# Patient Record
Sex: Male | Born: 1990
Health system: Southern US, Community
[De-identification: ages and names within clinical notes are randomized; demographics above are authoritative.]

## PROBLEM LIST (undated history)

## (undated) DIAGNOSIS — F32A Depression, unspecified: Secondary | ICD-10-CM

## (undated) DIAGNOSIS — F329 Major depressive disorder, single episode, unspecified: Secondary | ICD-10-CM

## (undated) DIAGNOSIS — F419 Anxiety disorder, unspecified: Secondary | ICD-10-CM

## (undated) HISTORY — PX: UPPER GI ENDOSCOPY: SHX6162

---
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
## 2012-05-22 ENCOUNTER — Emergency Department: Payer: Self-pay | Admitting: Emergency Medicine

## 2012-05-22 LAB — COMPREHENSIVE METABOLIC PANEL
Bilirubin,Total: 0.9 mg/dL (ref 0.2–1.0)
Calcium, Total: 9.5 mg/dL (ref 9.0–10.7)
Chloride: 106 mmol/L (ref 98–107)
Co2: 27 mmol/L (ref 21–32)
Creatinine: 1 mg/dL (ref 0.60–1.30)
EGFR (Non-African Amer.): >60
Osmolality: 273 (ref 275–301)
Potassium: 3.8 mmol/L (ref 3.5–5.1)
SGPT (ALT): 21 U/L (ref 12–78)
Sodium: 137 mmol/L (ref 136–145)

## 2012-05-22 LAB — URINALYSIS, COMPLETE
Bilirubin,UR: NEGATIVE
Blood: NEGATIVE
Glucose,UR: NEGATIVE mg/dL (ref 0–75)
Leukocyte Esterase: NEGATIVE
Nitrite: NEGATIVE
Protein: NEGATIVE
RBC,UR: NONE SEEN /HPF (ref 0–5)
Squamous Epithelial: 1

## 2012-05-22 LAB — CBC
HGB: 17.8 g/dL (ref 13.0–18.0)
MCHC: 36.1 g/dL — ABNORMAL HIGH (ref 32.0–36.0)
MCV: 88 fL (ref 80–100)
Platelet: 217 10*3/uL (ref 150–440)
RDW: 12.9 % (ref 11.5–14.5)

## 2012-05-22 LAB — LIPASE, BLOOD: Lipase: 138 U/L (ref 73–393)

## 2013-03-05 ENCOUNTER — Ambulatory Visit: Payer: Self-pay | Admitting: Gastroenterology

## 2013-03-08 LAB — PATHOLOGY REPORT

## 2013-04-02 ENCOUNTER — Other Ambulatory Visit: Payer: Self-pay | Admitting: Gastroenterology

## 2013-05-03 ENCOUNTER — Ambulatory Visit: Payer: Self-pay | Admitting: Gastroenterology

## 2017-04-02 ENCOUNTER — Ambulatory Visit
Admission: RE | Admit: 2017-04-02 | Discharge: 2017-04-02 | Disposition: A | Discharge status: Home / Self Care | Attending: Family Medicine | Admitting: Family Medicine

## 2017-04-02 NOTE — ED Triage Notes (Addendum)
Patient arrives to ER in custody of [employer]. Patient here for forensic blood draws. Gives consent to this RN. 2 patient verification identification. Blood obtained from L AC, patient tolerated well, no adverse events. blood obtained with collection kit provided by officer. Pt alert and oriented X4, active, cooperative, pt in NAD. RR even and unlabored, color WNL.  Paper consent obtained. Chain of custody followed. Blood in custody of Officer Rollene Rotunda

## 2017-04-02 NOTE — ED Notes (Signed)
Patient presents to the ED for forensic blood draw.  Blood draw performed by Randel PiggAlly Riley, RN.  Patient tolerated well.  Consent form signed.  Site clean and dry.

## 2018-03-09 DIAGNOSIS — K219 Gastro-esophageal reflux disease without esophagitis: Secondary | ICD-10-CM | POA: Diagnosis not present

## 2018-05-12 ENCOUNTER — Other Ambulatory Visit: Payer: Self-pay

## 2018-05-12 ENCOUNTER — Inpatient Hospital Stay
Admission: EM | Admit: 2018-05-12 | Discharge: 2018-05-14 | DRG: 871 | Disposition: A | Discharge status: Home / Self Care | Payer: Commercial Managed Care - PPO | Attending: Specialist | Admitting: Specialist

## 2018-05-12 ENCOUNTER — Emergency Department: Payer: Commercial Managed Care - PPO

## 2018-05-12 DIAGNOSIS — T402X1A Poisoning by other opioids, accidental (unintentional), initial encounter: Secondary | ICD-10-CM | POA: Diagnosis not present

## 2018-05-12 DIAGNOSIS — R7989 Other specified abnormal findings of blood chemistry: Secondary | ICD-10-CM | POA: Diagnosis present

## 2018-05-12 DIAGNOSIS — A419 Sepsis, unspecified organism: Secondary | ICD-10-CM | POA: Diagnosis not present

## 2018-05-12 DIAGNOSIS — G92 Toxic encephalopathy: Secondary | ICD-10-CM | POA: Diagnosis present

## 2018-05-12 DIAGNOSIS — R0902 Hypoxemia: Secondary | ICD-10-CM | POA: Diagnosis present

## 2018-05-12 DIAGNOSIS — T40601A Poisoning by unspecified narcotics, accidental (unintentional), initial encounter: Secondary | ICD-10-CM | POA: Diagnosis present

## 2018-05-12 DIAGNOSIS — Z20828 Contact with and (suspected) exposure to other viral communicable diseases: Secondary | ICD-10-CM | POA: Diagnosis present

## 2018-05-12 DIAGNOSIS — J690 Pneumonitis due to inhalation of food and vomit: Secondary | ICD-10-CM | POA: Diagnosis not present

## 2018-05-12 DIAGNOSIS — R778 Other specified abnormalities of plasma proteins: Secondary | ICD-10-CM | POA: Diagnosis present

## 2018-05-12 DIAGNOSIS — R000 Tachycardia, unspecified: Secondary | ICD-10-CM | POA: Diagnosis not present

## 2018-05-12 DIAGNOSIS — Y92008 Other place in unspecified non-institutional (private) residence as the place of occurrence of the external cause: Secondary | ICD-10-CM

## 2018-05-12 DIAGNOSIS — I248 Other forms of acute ischemic heart disease: Secondary | ICD-10-CM | POA: Diagnosis present

## 2018-05-12 DIAGNOSIS — I340 Nonrheumatic mitral (valve) insufficiency: Secondary | ICD-10-CM | POA: Diagnosis not present

## 2018-05-12 HISTORY — DX: Anxiety disorder, unspecified: F41.9

## 2018-05-12 HISTORY — DX: Major depressive disorder, single episode, unspecified: F32.9

## 2018-05-12 HISTORY — DX: Depression, unspecified: F32.A

## 2018-05-12 LAB — CBC
HCT: 46 % (ref 39.0–52.0)
Hemoglobin: 16.7 g/dL (ref 13.0–17.0)
MCH: 33.4 pg (ref 26.0–34.0)
MCHC: 36.3 g/dL — ABNORMAL HIGH (ref 30.0–36.0)
MCV: 92 fL (ref 80.0–100.0)
Platelets: 221 10*3/uL (ref 150–400)
RBC: 5 MIL/uL (ref 4.22–5.81)
RDW: 11.8 % (ref 11.5–15.5)
WBC: 21.3 10*3/uL — ABNORMAL HIGH (ref 4.0–10.5)
nRBC: 0 % (ref 0.0–0.2)

## 2018-05-12 LAB — COMPREHENSIVE METABOLIC PANEL
ALT: 30 U/L (ref 0–44)
AST: 40 U/L (ref 15–41)
Albumin: 4.7 g/dL (ref 3.5–5.0)
Alkaline Phosphatase: 57 U/L (ref 38–126)
Anion gap: 14 (ref 5–15)
BUN: 14 mg/dL (ref 6–20)
CO2: 23 mmol/L (ref 22–32)
Calcium: 8.8 mg/dL — ABNORMAL LOW (ref 8.9–10.3)
Chloride: 106 mmol/L (ref 98–111)
Creatinine, Ser: 1.2 mg/dL (ref 0.61–1.24)
GFR calc Af Amer: >60 mL/min (ref >60)
GFR calc non Af Amer: >60 mL/min (ref >60)
Glucose, Bld: 111 mg/dL — ABNORMAL HIGH (ref 70–99)
Potassium: 4.4 mmol/L (ref 3.5–5.1)
Sodium: 143 mmol/L (ref 135–145)
Total Bilirubin: 0.8 mg/dL (ref 0.3–1.2)
Total Protein: 7.9 g/dL (ref 6.5–8.1)

## 2018-05-12 LAB — URINE DRUG SCREEN, QUALITATIVE (ARMC ONLY)
Amphetamines, Ur Screen: NOT DETECTED
Barbiturates, Ur Screen: NOT DETECTED
Benzodiazepine, Ur Scrn: NOT DETECTED
Cannabinoid 50 Ng, Ur ~~LOC~~: POSITIVE — AB
Cocaine Metabolite,Ur ~~LOC~~: NOT DETECTED
MDMA (Ecstasy)Ur Screen: NOT DETECTED
Methadone Scn, Ur: NOT DETECTED
Opiate, Ur Screen: NOT DETECTED
Phencyclidine (PCP) Ur S: NOT DETECTED
Tricyclic, Ur Screen: NOT DETECTED

## 2018-05-12 LAB — ETHANOL: Alcohol, Ethyl (B): <10 mg/dL (ref <10)

## 2018-05-12 LAB — CK: Total CK: 239 U/L (ref 49–397)

## 2018-05-12 LAB — LACTIC ACID, PLASMA: Lactic Acid, Venous: 2.1 mmol/L (ref 0.5–1.9)

## 2018-05-12 LAB — SARS CORONAVIRUS 2 BY RT PCR (HOSPITAL ORDER, PERFORMED IN ~~LOC~~ HOSPITAL LAB): SARS Coronavirus 2: NEGATIVE

## 2018-05-12 LAB — TROPONIN I: Troponin I: 0.12 ng/mL (ref <0.03)

## 2018-05-12 MED ORDER — ONDANSETRON HCL 4 MG/2ML IJ SOLN
4.0000 mg | Freq: Once | INTRAMUSCULAR | Status: AC
  Administered 2018-05-12: 4 mg via INTRAVENOUS
  Filled 2018-05-12: qty 2

## 2018-05-12 MED ORDER — SODIUM CHLORIDE 0.9 % IV SOLN
3.0000 g | Freq: Once | INTRAVENOUS | Status: AC
  Administered 2018-05-12: 3 g via INTRAVENOUS
  Filled 2018-05-12: qty 3

## 2018-05-12 MED ORDER — SODIUM CHLORIDE 0.9 % IV BOLUS
1000.0000 mL | Freq: Once | INTRAVENOUS | Status: AC
  Administered 2018-05-13: 1000 mL via INTRAVENOUS

## 2018-05-12 MED ORDER — SODIUM CHLORIDE 0.9 % IV BOLUS
1000.0000 mL | Freq: Once | INTRAVENOUS | Status: AC
  Administered 2018-05-12: 1000 mL via INTRAVENOUS

## 2018-05-12 NOTE — ED Notes (Signed)
Pt bumped up to 5L [place] sats were at 84

## 2018-05-12 NOTE — ED Notes (Signed)
Patient sitting up in bed alert and awake, parents called to speak with patient and get an update on his status.. normal saline bolus completed. ABX infusing,. Patient requesting something to drink. Ok with Md to give patient po fluids.

## 2018-05-12 NOTE — ED Triage Notes (Signed)
Ingestion of an unknown amount of percocet's. Found by ems under car port unconscious 4mg  IV narcan with positive effects. Patient aox4 able to answer questions appropriately. C/o of back pain.

## 2018-05-12 NOTE — ED Notes (Signed)
FATHER (BOBBY) # [phone]

## 2018-05-12 NOTE — ED Notes (Signed)
Xray completed

## 2018-05-12 NOTE — ED Notes (Signed)
ED TO INPATIENT HANDOFF REPORT  ED Nurse Name and Phone #: Greta Doom [phone]  S Name/Age/Gender Miguel Fischer 28 y.o. male Room/Bed: ED16A/ED16A  Code Status   Code Status: Not on file  Home/SNF/Other Home Patient oriented to: self, place, time and situation Is this baseline? Was not A&Ox4 on arrival; is now   Triage Complete: Triage complete  Chief Complaint Overdose  Triage Note Ingestion of an unknown amount of percocet's. Found by ems under car port unconscious 4mg  IV narcan with positive effects. Patient aox4 able to answer questions appropriately. C/o of back pain.     Allergies No Known Allergies  Level of Care/Admitting Diagnosis ED Disposition    ED Disposition Condition Comment   Admit  The patient appears reasonably stabilized for admission considering the current resources, flow, and capabilities available in the ED at this time, and I doubt any other [facility] requiring further screening and/or treatment in the ED prior to admission is  present.       B Medical/Surgery History Past Medical History:  Diagnosis Date  . Anxiety   . Depression    Past Surgical History:  Procedure Laterality Date  . UPPER GI ENDOSCOPY       A IV Location/Drains/Wounds Patient Lines/Drains/Airways Status   Active Line/Drains/Airways    Name:   Placement date:   Placement time:   Site:   Days:   Peripheral IV 05/12/18 Left Antecubital   05/12/18    2045    Antecubital   less than 1          Intake/Output Last 24 hours  Intake/Output Summary (Last 24 hours) at 05/12/2018 2331 Last data filed at 05/12/2018 2213 Gross per 24 hour  Intake 1000 ml  Output -  Net 1000 ml    Labs/Imaging Results for orders placed or performed during the hospital encounter of 05/12/18 (from the past 48 hour(s))  CBC     Status: Abnormal   Collection Time: 05/12/18  9:05 PM  Result Value Ref Range   WBC 21.3 (H) 4.0 - 10.5 K/uL   RBC 5.00 4.22 - 5.81 MIL/uL   Hemoglobin 16.7 13.0 -  17.0 g/dL   HCT 21.6 24.4 - 69.5 %   MCV 92.0 80.0 - 100.0 fL   MCH 33.4 26.0 - 34.0 pg   MCHC 36.3 (H) 30.0 - 36.0 g/dL   RDW 07.2 25.7 - 50.5 %   Platelets 221 150 - 400 K/uL   nRBC 0.0 0.0 - 0.2 %    Comment: Performed at [facility], [address] [place], [place] [address]  Comprehensive metabolic panel     Status: Abnormal   Collection Time: 05/12/18  9:05 PM  Result Value Ref Range   Sodium 143 135 - 145 mmol/L   Potassium 4.4 3.5 - 5.1 mmol/L   Chloride 106 98 - 111 mmol/L   CO2 23 22 - 32 mmol/L   Glucose, Bld 111 (H) 70 - 99 mg/dL   BUN 14 6 - 20 mg/dL   Creatinine, Ser 8.25 0.61 - 1.24 mg/dL   Calcium 8.8 (L) 8.9 - 10.3 mg/dL   Total Protein 7.9 6.5 - 8.1 g/dL   Albumin 4.7 3.5 - 5.0 g/dL   AST 40 15 - 41 U/L   ALT 30 0 - 44 U/L   Alkaline Phosphatase 57 38 - 126 U/L   Total Bilirubin 0.8 0.3 - 1.2 mg/dL   GFR calc non Af Amer >60 >60 mL/min   GFR  calc Af Amer >60 >60 mL/min   Anion gap 14 5 - 15    Comment: Performed at [facility], [address] [place], [place] [address]  Troponin I - ONCE - STAT     Status: Abnormal   Collection Time: 05/12/18  9:05 PM  Result Value Ref Range   Troponin I 0.12 (HH) <0.03 ng/mL    Comment: CRITICAL RESULT CALLED TO, READ BACK BY AND VERIFIED WITH RAQUEL DAVID AT 2151 ON 05/12/2018 [facility]. Performed at [facility], [address] [place], [place] [address]   Ethanol     Status: None   Collection Time: 05/12/18  9:05 PM  Result Value Ref Range   Alcohol, Ethyl (B) <10 <10 mg/dL    Comment: (NOTE) Lowest detectable limit for serum alcohol is 10 mg/dL. For medical purposes only. Performed at [facility], [address] [place], [place] [address]   SARS Coronavirus 2 (CEPHEID - Performed in [facility] hospital lab), Hosp Order     Status: None   Collection Time: 05/12/18  9:05 PM  Result Value Ref Range   SARS Coronavirus 2 NEGATIVE NEGATIVE    Comment: (NOTE) If result is  NEGATIVE SARS-CoV-2 target nucleic acids are NOT DETECTED. The SARS-CoV-2 RNA is generally detectable in upper and lower  respiratory specimens during the acute phase of infection. The lowest  concentration of SARS-CoV-2 viral copies this assay can detect is 250  copies / mL. A negative result does not preclude SARS-CoV-2 infection  and should not be used as the sole basis for treatment or other  patient management decisions.  A negative result may occur with  improper specimen collection / handling, submission of specimen other  than nasopharyngeal swab, presence of viral mutation(s) within the  areas targeted by this assay, and inadequate number of viral copies  (<250 copies / mL). A negative result must be combined with clinical  observations, patient history, and epidemiological information. If result is POSITIVE SARS-CoV-2 target nucleic acids are DETECTED. The SARS-CoV-2 RNA is generally detectable in upper and lower  respiratory specimens dur ing the acute phase of infection.  Positive  results are indicative of active infection with SARS-CoV-2.  Clinical  correlation with patient history and other diagnostic information is  necessary to determine patient infection status.  Positive results do  not rule out bacterial infection or co-infection with other viruses. If result is PRESUMPTIVE POSTIVE SARS-CoV-2 nucleic acids MAY BE PRESENT.   A presumptive positive result was obtained on the submitted specimen  and confirmed on repeat testing.  While 2019 novel coronavirus  (SARS-CoV-2) nucleic acids may be present in the submitted sample  additional confirmatory testing may be necessary for epidemiological  and / or clinical management purposes  to differentiate between  SARS-CoV-2 and other Sarbecovirus currently known to infect humans.  If clinically indicated additional testing with an alternate test  methodology [phone]) is advised. The SARS-CoV-2 RNA is generally  detectable  in upper and lower respiratory sp ecimens during the acute  phase of infection. The expected result is Negative. Fact Sheet for Patients:  BoilerBrush.com.cy Fact Sheet for Healthcare Providers: https://pope.com/ This test is not yet approved or cleared by the [place] FDA and has been authorized for detection and/or diagnosis of SARS-CoV-2 by FDA under an Emergency Use Authorization (EUA).  This EUA will remain in effect (meaning this test can be used) for the duration of the COVID-19 declaration under Section 564(b)(1) of the Act, 21 U.S.C.  section 360bbb-3(b)(1), unless the authorization is terminated or revoked sooner. Performed at [facility], [address] [place], [place] [address]   CK     Status: None   Collection Time: 05/12/18  9:05 PM  Result Value Ref Range   Total CK 239 49 - 397 U/L    Comment: Performed at [facility], [address] [place], [place] [address]  Urine Drug Screen, Qualitative ([facility] only)     Status: Abnormal   Collection Time: 05/12/18  9:05 PM  Result Value Ref Range   Tricyclic, Ur Screen NONE DETECTED NONE DETECTED   Amphetamines, Ur Screen NONE DETECTED NONE DETECTED   MDMA (Ecstasy)Ur Screen NONE DETECTED NONE DETECTED   Cocaine Metabolite,Ur [place] NONE DETECTED NONE DETECTED   Opiate, Ur Screen NONE DETECTED NONE DETECTED   Phencyclidine (PCP) Ur S NONE DETECTED NONE DETECTED   Cannabinoid 50 Ng, Ur [place] POSITIVE (A) NONE DETECTED   Barbiturates, Ur Screen NONE DETECTED NONE DETECTED   Benzodiazepine, Ur Scrn NONE DETECTED NONE DETECTED   Methadone Scn, Ur NONE DETECTED NONE DETECTED    Comment: (NOTE) Tricyclics + metabolites, urine    Cutoff 1000 ng/mL Amphetamines + metabolites, urine  Cutoff 1000 ng/mL MDMA (Ecstasy), urine              Cutoff 500 ng/mL Cocaine Metabolite, urine          Cutoff 300 ng/mL Opiate + metabolites, urine        Cutoff 300  ng/mL Phencyclidine (PCP), urine         Cutoff 25 ng/mL Cannabinoid, urine                 Cutoff 50 ng/mL Barbiturates + metabolites, urine  Cutoff 200 ng/mL Benzodiazepine, urine              Cutoff 200 ng/mL Methadone, urine                   Cutoff 300 ng/mL The urine drug screen provides only a preliminary, unconfirmed analytical test result and should not be used for non-medical purposes. Clinical consideration and professional judgment should be applied to any positive drug screen result due to possible interfering substances. A more specific alternate chemical method must be used in order to obtain a confirmed analytical result. Gas chromatography / mass spectrometry (GC/MS) is the preferred confirmat ory method. Performed at [facility], [address] [place], [place] [address]   Lactic acid, plasma     Status: Abnormal   Collection Time: 05/12/18 10:29 PM  Result Value Ref Range   Lactic Acid, Venous 2.1 (HH) 0.5 - 1.9 mmol/L    Comment: CRITICAL RESULT CALLED TO, READ BACK BY AND VERIFIED WITH JEANETTE PEREZ AT 2259 ON 05/12/2018 [facility]. Performed at [facility], [address] [place], [place] [address]    Dg Chest Portable 1 View  Result Date: 05/12/2018 CLINICAL DATA:  Overdose, shortness of breath EXAM: PORTABLE CHEST 1 VIEW COMPARISON:  12/26/2009 FINDINGS: Patchy airspace disease throughout the lungs. Heart is normal size. No effusions. No acute bony abnormality. IMPRESSION: Patchy ill-defined bilateral airspace disease. This could reflect noncardiogenic edema, less likely infection. Electronically Signed   By: Charlett Nose M.D.   On: 05/12/2018 21:09    Pending Labs Unresulted Labs (From admission, onward)    Start     Ordered   05/13/18 0030  Lactic acid, plasma  Once,   STAT     05/12/18  2328   05/12/18 2155  Blood culture (routine x 2)  BLOOD CULTURE X 2,   STAT     05/12/18 2155          Vitals/Pain Today's Vitals   05/12/18 2055  05/12/18 2057 05/12/18 2100 05/12/18 2216  BP: 115/72  112/73 111/70  Pulse: (!) 138  (!) 134 (!) 132  Resp:   (!) 30 (!) 24  Temp: 98.2 F (36.8 C)     TempSrc: Oral     SpO2: (!) 86%  (!) 89% 92%  Weight:  79.8 kg    Height:  5\' 10"  (1.778 m)    PainSc:  6   0-No pain    Isolation Precautions No active isolations  Medications Medications  sodium chloride 0.9 % bolus 1,000 mL (has no administration in time range)  sodium chloride 0.9 % bolus 1,000 mL (0 mLs Intravenous Stopped 05/12/18 2213)  ondansetron (ZOFRAN) injection 4 mg (4 mg Intravenous Given 05/12/18 2110)  Ampicillin-Sulbactam (UNASYN) 3 g in sodium chloride 0.9 % 100 mL IVPB (3 g Intravenous New Bag/Given 05/12/18 2215)    Mobility walks Low fall risk   Focused Assessments Remains ST on monitor; remains on 4L [place]   R Recommendations: See Admitting Provider Note  Report given to:   Additional Notes:  Given narcan before arrival; septic workup completed

## 2018-05-12 NOTE — ED Provider Notes (Signed)
[facility] Emergency Department Provider Note  Time seen: 9:05 PM  I have reviewed the triage vital signs and the nursing notes.   HISTORY  Chief Complaint Drug Overdose   HPI Miguel Fischer is a 28 y.o. male with a past medical history anxiety, depression, presents to the emergency department being found unresponsive.  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  Patient states he snorted a tablet tonight, does not know if he took any more than that or not.  Patient was last heard from around 4 PM and was found around 8 PM unresponsive at his home.   Patient received 4 mg of intranasal Narcan without response, received 4 mg of IV Narcan and the patient awoke.  Patient on arrival is awake alert oriented however he remains somewhat hypoxic satting in the upper 80s on room air.  Patient is tachycardic around 140 bpm.  Patient does state minimal shortness of breath.  Denies any recent cough or fever.   Past Medical History:  Diagnosis Date  . Anxiety   . Depression     There are no active problems to display for this patient.   History reviewed. No pertinent surgical history.  Prior to Admission medications   Not on File    No Known Allergies  History reviewed. No pertinent family history.  Social History Social History   Tobacco Use  . Smoking status: Never Smoker  . Smokeless tobacco: Never Used  Substance Use Topics  . Alcohol use: Not on file  . Drug use: Not on file    Review of Systems Constitutional: Negative for fever. ENT: Negative for recent illness/congestion Cardiovascular: Negative for chest pain. Respiratory: Mild shortness of breath currently. Gastrointestinal: Negative for abdominal pain, vomiting Musculoskeletal: Negative for musculoskeletal complaints Skin: Negative for skin complaints  Neurological: Negative for headache All other ROS  negative  ____________________________________________   PHYSICAL EXAM:  VITAL SIGNS: ED Triage Vitals  Enc Vitals Group     BP 05/12/18 2055 115/72     Pulse Rate 05/12/18 2055 (!) 138     Resp --      Temp 05/12/18 2055 98.2 F (36.8 C)     Temp Source 05/12/18 2055 Oral     SpO2 05/12/18 2055 (!) 86 %     Weight 05/12/18 2057 176 lb (79.8 kg)     Height 05/12/18 2057 5\' 10"  (1.778 m)     Head Circumference --      Peak Flow --      Pain Score 05/12/18 2057 6     Pain Loc --      Pain Edu? --      Excl. in GC? --    Constitutional: Alert and oriented. Well appearing and in no distress. Eyes: Normal exam ENT      Head: Normocephalic and atraumatic.      Mouth/Throat: Mucous membranes are moist. Cardiovascular: Regular rhythm, rate around 140 bpm. Respiratory: Normal respiratory effort without tachypnea nor retractions. Breath sounds are clear Gastrointestinal: Soft and nontender. No distention.   Musculoskeletal: Nontender with normal range of motion in all extremities.  Neurologic:  Normal speech and language. No gross focal neurologic deficits  Skin:  Skin is warm, dry and intact.  Psychiatric: Mood and affect are normal.   ____________________________________________    EKG  EKG viewed and interpreted by myself shows a sinus tachycardia 140 bpm with a narrow QRS, normal axis, normal intervals, no  concerning ST changes noted.  ____________________________________________    RADIOLOGY  IMPRESSION: Patchy ill-defined bilateral airspace disease. This could reflect noncardiogenic edema, less likely infection.  ____________________________________________   INITIAL IMPRESSION / ASSESSMENT AND PLAN / ED COURSE  Pertinent labs & imaging results that were available during my care of the patient were reviewed by me and considered in my medical decision making (see chart for details).   Patient presents emergency department after opioid overdose.  Patient admits  to snorting Percocet.  Received 4 mg of intranasal Narcan 4 mg of IV Narcan the patient is awake alert and oriented x4 upon arrival.  Patient remained somewhat hypoxic in the upper 80s on room air.  Patient could have been down for up to 4 hours.  We will check labs, chest x-ray, treat with IV fluids and nausea medication.  We will test for corona as a precaution given his low saturation.  We will continue to closely monitor in the emergency department.  Differential for the hypoxia at this time could be drug related although the patient appears to be breathing normally at this time.  Could be part related that the patient had prolonged downtime, could be pneumonia or upper respiratory infection related.  Patient remains hypoxic and tachycardic.  Chest x-ray consistent with bilateral opacities possible noncardiogenic pulmonary edema.  Patient's white blood cell count is elevated to 21,000 and elevated troponin 0 0.12.  This is very likely related to prolonged downtime.  We will continue with IV fluids, cover with IV Unasyn for aspiration pneumonia.  Patient requiring 2 L of oxygen to maintain sats in the 90s.  Patient will be admitted to the hospital service for further treatment.  Miguel Fischer was evaluated in Emergency Department on 05/12/2018 for the symptoms described in the history of present illness. He was evaluated in the context of the global COVID-19 pandemic, which necessitated consideration that the patient might be at risk for infection with the SARS-CoV-2 virus that causes COVID-19. Institutional protocols and algorithms that pertain to the evaluation of patients at risk for COVID-19 are in a state of rapid change based on information released by regulatory bodies including the CDC and federal and state organizations. These policies and algorithms were followed during the patient's care in the ED.  ____________________________________________   FINAL CLINICAL IMPRESSION(S) / ED  DIAGNOSES  Opioid overdose Aspiration pneumonia   Minna AntisPaduchowski, Ovide Dusek, MD 05/12/18 2208

## 2018-05-13 ENCOUNTER — Inpatient Hospital Stay (HOSPITAL_COMMUNITY)
Admit: 2018-05-13 | Discharge: 2018-05-13 | Disposition: A | Discharge status: Home / Self Care | Payer: Commercial Managed Care - PPO | Attending: Internal Medicine | Admitting: Internal Medicine

## 2018-05-13 DIAGNOSIS — I248 Other forms of acute ischemic heart disease: Secondary | ICD-10-CM | POA: Diagnosis present

## 2018-05-13 DIAGNOSIS — Y92008 Other place in unspecified non-institutional (private) residence as the place of occurrence of the external cause: Secondary | ICD-10-CM | POA: Diagnosis not present

## 2018-05-13 DIAGNOSIS — I340 Nonrheumatic mitral (valve) insufficiency: Secondary | ICD-10-CM | POA: Diagnosis not present

## 2018-05-13 DIAGNOSIS — A419 Sepsis, unspecified organism: Secondary | ICD-10-CM | POA: Diagnosis present

## 2018-05-13 DIAGNOSIS — T402X1A Poisoning by other opioids, accidental (unintentional), initial encounter: Secondary | ICD-10-CM | POA: Diagnosis present

## 2018-05-13 DIAGNOSIS — J690 Pneumonitis due to inhalation of food and vomit: Secondary | ICD-10-CM | POA: Diagnosis present

## 2018-05-13 DIAGNOSIS — R778 Other specified abnormalities of plasma proteins: Secondary | ICD-10-CM | POA: Diagnosis present

## 2018-05-13 DIAGNOSIS — T40601A Poisoning by unspecified narcotics, accidental (unintentional), initial encounter: Secondary | ICD-10-CM | POA: Diagnosis present

## 2018-05-13 DIAGNOSIS — R0902 Hypoxemia: Secondary | ICD-10-CM | POA: Diagnosis present

## 2018-05-13 DIAGNOSIS — R7989 Other specified abnormal findings of blood chemistry: Secondary | ICD-10-CM

## 2018-05-13 DIAGNOSIS — G92 Toxic encephalopathy: Secondary | ICD-10-CM | POA: Diagnosis present

## 2018-05-13 DIAGNOSIS — Z20828 Contact with and (suspected) exposure to other viral communicable diseases: Secondary | ICD-10-CM | POA: Diagnosis present

## 2018-05-13 LAB — BASIC METABOLIC PANEL
Anion gap: 7 (ref 5–15)
BUN: 14 mg/dL (ref 6–20)
CO2: 24 mmol/L (ref 22–32)
Calcium: 7.9 mg/dL — ABNORMAL LOW (ref 8.9–10.3)
Chloride: 107 mmol/L (ref 98–111)
Creatinine, Ser: 0.97 mg/dL (ref 0.61–1.24)
GFR calc Af Amer: >60 mL/min (ref >60)
GFR calc non Af Amer: >60 mL/min (ref >60)
Glucose, Bld: 99 mg/dL (ref 70–99)
Potassium: 4.5 mmol/L (ref 3.5–5.1)
Sodium: 138 mmol/L (ref 135–145)

## 2018-05-13 LAB — CBC
HCT: 37.5 % — ABNORMAL LOW (ref 39.0–52.0)
Hemoglobin: 13.5 g/dL (ref 13.0–17.0)
MCH: 32.3 pg (ref 26.0–34.0)
MCHC: 36 g/dL (ref 30.0–36.0)
MCV: 89.7 fL (ref 80.0–100.0)
Platelets: 195 10*3/uL (ref 150–400)
RBC: 4.18 MIL/uL — ABNORMAL LOW (ref 4.22–5.81)
RDW: 11.8 % (ref 11.5–15.5)
WBC: 15.6 10*3/uL — ABNORMAL HIGH (ref 4.0–10.5)
nRBC: 0 % (ref 0.0–0.2)

## 2018-05-13 LAB — ECHOCARDIOGRAM COMPLETE
Height: 70 in
Weight: 2838.4 oz

## 2018-05-13 LAB — TROPONIN I
Troponin I: 0.25 ng/mL (ref <0.03)
Troponin I: 0.21 ng/mL (ref <0.03)

## 2018-05-13 LAB — LACTIC ACID, PLASMA: Lactic Acid, Venous: 1.1 mmol/L (ref 0.5–1.9)

## 2018-05-13 MED ORDER — SERTRALINE HCL 50 MG PO TABS
100.0000 mg | ORAL_TABLET | Freq: Every day | ORAL | Status: DC
  Administered 2018-05-13 – 2018-05-14 (×2): 100 mg via ORAL
  Filled 2018-05-13 (×2): qty 2

## 2018-05-13 MED ORDER — ONDANSETRON HCL 4 MG PO TABS
4.0000 mg | ORAL_TABLET | Freq: Four times a day (QID) | ORAL | Status: DC | PRN
  Administered 2018-05-13: 4 mg via ORAL
  Filled 2018-05-13: qty 1

## 2018-05-13 MED ORDER — SODIUM CHLORIDE 0.9 % IV SOLN
INTRAVENOUS | Status: DC | PRN
  Administered 2018-05-13: 250 mL via INTRAVENOUS
  Administered 2018-05-14: 10 mL via INTRAVENOUS

## 2018-05-13 MED ORDER — SODIUM CHLORIDE 0.9 % IV SOLN: 3.0000 g | Freq: Three times a day (TID) | INTRAVENOUS | Status: DC

## 2018-05-13 MED ORDER — SODIUM CHLORIDE 0.9 % IV SOLN
INTRAVENOUS | Status: AC
  Administered 2018-05-13: 02:00:00 via INTRAVENOUS

## 2018-05-13 MED ORDER — SODIUM CHLORIDE 0.9 % IV SOLN
3.0000 g | Freq: Four times a day (QID) | INTRAVENOUS | Status: DC
  Administered 2018-05-13 – 2018-05-14 (×6): 3 g via INTRAVENOUS
  Filled 2018-05-13 (×9): qty 3

## 2018-05-13 MED ORDER — ACETAMINOPHEN 650 MG RE SUPP: 650.0000 mg | Freq: Four times a day (QID) | RECTAL | Status: DC | PRN

## 2018-05-13 MED ORDER — ONDANSETRON HCL 4 MG/2ML IJ SOLN: 4.0000 mg | Freq: Four times a day (QID) | INTRAMUSCULAR | Status: DC | PRN

## 2018-05-13 MED ORDER — SODIUM CHLORIDE 0.9% FLUSH
3.0000 mL | Freq: Two times a day (BID) | INTRAVENOUS | Status: DC
  Administered 2018-05-13: 3 mL via INTRAVENOUS
  Administered 2018-05-14: 05:00:00 via INTRAVENOUS
  Administered 2018-05-14: 3 mL via INTRAVENOUS

## 2018-05-13 MED ORDER — ACETAMINOPHEN 325 MG PO TABS: 650.0000 mg | ORAL_TABLET | Freq: Four times a day (QID) | ORAL | Status: DC | PRN

## 2018-05-13 MED ORDER — ENOXAPARIN SODIUM 40 MG/0.4ML ~~LOC~~ SOLN
40.0000 mg | SUBCUTANEOUS | Status: DC
  Administered 2018-05-13 – 2018-05-14 (×2): 40 mg via SUBCUTANEOUS
  Filled 2018-05-13 (×2): qty 0.4

## 2018-05-13 NOTE — Progress Notes (Signed)
Pt weaned to room air. Saturation is 95% at rest and 97% with ambulation around the nurses station. I will continue to assess.

## 2018-05-13 NOTE — Progress Notes (Signed)
[place] [facility] [place] NAME: Miguel Fischer    MR#:  [id number]  DATE OF BIRTH:  1990-08-29  SUBJECTIVE:   Patient presented to the hospital secondary to drug overdose secondary to opioids and suspected to have aspiration pneumonia.  No acute events overnight, more awake and alert today.  No nausea or vomiting.  REVIEW OF SYSTEMS:    Review of Systems  Constitutional: Negative for chills and fever.  HENT: Negative for congestion and tinnitus.   Eyes: Negative for blurred vision and double vision.  Respiratory: Negative for cough, shortness of breath and wheezing.   Cardiovascular: Negative for chest pain, orthopnea and PND.  Gastrointestinal: Negative for abdominal pain, diarrhea, nausea and vomiting.  Genitourinary: Negative for dysuria and hematuria.  Neurological: Negative for dizziness, sensory change and focal weakness.  All other systems reviewed and are negative.   Nutrition: Regular Tolerating Diet: Yes Tolerating PT: Ambulatory  DRUG ALLERGIES:  No Known Allergies  VITALS:  Blood pressure 108/78, pulse 92, temperature 98 F (36.7 C), temperature source Oral, resp. rate 18, height _0  (1.778 m), weight 80.5 kg, SpO2 95 %.  PHYSICAL EXAMINATION:   Physical Exam  GENERAL:  28 y.o.-year-old patient lying in bed in no acute distress.  EYES: Pupils equal, round, reactive to light and accommodation. No scleral icterus. Extraocular muscles intact.  HEENT: Head atraumatic, normocephalic. Oropharynx and nasopharynx clear.  NECK:  Supple, no jugular venous distention. No thyroid enlargement, no tenderness.  LUNGS: Normal breath sounds bilaterally, minimal end-exp wheezing b/l, No rales, rhonchi. No use of accessory muscles of respiration.  CARDIOVASCULAR: S1, S2 normal. No murmurs, rubs, or gallops.  ABDOMEN: Soft, nontender, nondistended. Bowel sounds present. No organomegaly or mass.  EXTREMITIES: No cyanosis, clubbing or  edema b/l.    NEUROLOGIC: Cranial nerves II through XII are intact. No focal Motor or sensory deficits b/l.   PSYCHIATRIC: The patient is alert and oriented x 3.  SKIN: No obvious rash, lesion, or ulcer.    LABORATORY PANEL:   CBC Recent Labs  Lab 05/13/18 0231  WBC 15.6*  HGB 13.5  HCT 37.5*  PLT 195   ------------------------------------------------------------------------------------------------------------------  Chemistries  Recent Labs  Lab 05/12/18 2105 05/13/18 0231  NA 143 138  K 4.4 4.5  CL 106 107  CO2 23 24  GLUCOSE 111* 99  BUN 14 14  CREATININE 1.20 0.97  CALCIUM 8.8* 7.9*  AST 40  --   ALT 30  --   ALKPHOS 57  --   BILITOT 0.8  --    ------------------------------------------------------------------------------------------------------------------  Cardiac Enzymes Recent Labs  Lab 05/13/18 0918  TROPONINI 0.21*   ------------------------------------------------------------------------------------------------------------------  RADIOLOGY:  Dg Chest Portable 1 View  Result Date: 05/12/2018 CLINICAL DATA:  Overdose, shortness of breath EXAM: PORTABLE CHEST 1 VIEW COMPARISON:  12/26/2009 FINDINGS: Patchy airspace disease throughout the lungs. Heart is normal size. No effusions. No acute bony abnormality. IMPRESSION: Patchy ill-defined bilateral airspace disease. This could reflect noncardiogenic edema, less likely infection. Electronically Signed   By: Rolm Baptise M.D.   On: 05/12/2018 21:09     ASSESSMENT AND PLAN:   28 year old male with past medical history of depression, substance abuse who presented to the hospital due to altered mental status and suspected sepsis.  1.  Altered mental status/encephalopathy- secondary to substance abuse.  Patient apparently had snorted too much Percocet and became unresponsive.  Patient was given Narcan. - Mental status much improved and back to baseline.  2.  Sepsis-patient met criteria given leukocytosis,  elevated lactic acid and possible aspiration pneumonia on chest x-ray. -Continue IV Unasyn, follow hemodynamics.  Follow cultures.  3.  Aspiration pneumonia- continue IV Unasyn, follow cultures and clinically.    4.  Elevated troponin- suspected to be secondary to demand ischemia from patient receiving CPR while he was unresponsive. - Await echocardiogram results.     All the records are reviewed and case discussed with Care Management/Social Worker. Management plans discussed with the patient, family and they are in agreement.  CODE STATUS: Full code  DVT Prophylaxis: Lovenox  TOTAL TIME TAKING CARE OF THIS PATIENT: 30 minutes.   POSSIBLE D/C IN 1-2 DAYS, DEPENDING ON CLINICAL CONDITION.   Henreitta Leber M.D on 05/13/2018 at 1:43 PM  Between 7am to 6pm - Pager - [phone]  After 6pm go to [email or web address] - Proofreader  [facility]  Office  [phone]  CC: Primary care physician; Juluis Pitch, MD

## 2018-05-13 NOTE — Progress Notes (Signed)
*  PRELIMINARY RESULTS* Echocardiogram 2D Echocardiogram has been performed.  Joanette Gula Ailsa Mireles 05/13/2018, 10:07 AM

## 2018-05-13 NOTE — Plan of Care (Signed)
Pt arrived to unit from ED this shift. Alert and oriented x 4. Attempted to wean from O2@4L ; however, spO2 dropped to 92% quickly. Placed back on 4L. VSS Will continue to monitor.   Problem: Health Behavior/Discharge Planning: Goal: Ability to manage health-related needs will improve Outcome: Progressing   Problem: Clinical Measurements: Goal: Ability to maintain clinical measurements within normal limits will improve Outcome: Progressing Goal: Will remain free from infection Outcome: Progressing Goal: Respiratory complications will improve Outcome: Progressing   Problem: Activity: Goal: Risk for activity intolerance will decrease Outcome: Progressing   Problem: Coping: Goal: Level of anxiety will decrease Outcome: Progressing   Problem: Safety: Goal: Ability to remain free from injury will improve Outcome: Progressing

## 2018-05-13 NOTE — ED Notes (Signed)
Pt trialled on 2L [place] while transporting to 245 with this RN. Sat 97%.

## 2018-05-13 NOTE — H&P (Signed)
[facility] [place] at [facility]   PATIENT NAME: Miguel Fischer    MR#:  [id number]  DATE OF BIRTH:  21-Dec-1990  DATE OF ADMISSION:  05/12/2018  PRIMARY CARE PHYSICIAN: Dorothey Baseman, MD   REQUESTING/REFERRING PHYSICIAN: Lenard Lance, MD  CHIEF COMPLAINT:   Chief Complaint  Patient presents with  . Drug Overdose    HISTORY OF PRESENT ILLNESS:  Miguel Fischer  is a 28 y.o. male who presents with chief complaint as above.  Patient presents the ED after being found unresponsive.  He states that he snorted some Percocet earlier today, and was found unresponsive after being down on the concrete for about 4 hours.  He is more awake now and alert and oriented and talking.  Evaluation here in the ED shows opacities on chest x-ray, potentially aspiration event.  He does meet sepsis criteria, and has new oxygen requirement.Marland Kitchen  Hospitalist were called for admission  PAST MEDICAL HISTORY:   Past Medical History:  Diagnosis Date  . Anxiety   . Depression      PAST SURGICAL HISTORY:   Past Surgical History:  Procedure Laterality Date  . UPPER GI ENDOSCOPY       SOCIAL HISTORY:   Social History   Tobacco Use  . Smoking status: Never Smoker  . Smokeless tobacco: Never Used  Substance Use Topics  . Alcohol use: Not on file     FAMILY HISTORY:   Family History  Problem Relation Age of Onset  . Depression Mother   . Crohn's disease Father   . Depression Sister   . Diabetes Sister      DRUG ALLERGIES:  No Known Allergies  MEDICATIONS AT HOME:   Prior to Admission medications   Not on File    REVIEW OF SYSTEMS:  Review of Systems  Constitutional: Negative for chills, fever, malaise/fatigue and weight loss.  HENT: Negative for ear pain, hearing loss and tinnitus.   Eyes: Negative for blurred vision, double vision, pain and redness.  Respiratory: Positive for shortness of breath. Negative for cough and hemoptysis.   Cardiovascular:  Negative for chest pain, palpitations, orthopnea and leg swelling.  Gastrointestinal: Negative for abdominal pain, constipation, diarrhea, nausea and vomiting.  Genitourinary: Negative for dysuria, frequency and hematuria.  Musculoskeletal: Negative for back pain, joint pain and neck pain.  Skin:       No acne, rash, or lesions  Neurological: Positive for loss of consciousness. Negative for dizziness, tremors, focal weakness and weakness.  Endo/Heme/Allergies: Negative for polydipsia. Does not bruise/bleed easily.  Psychiatric/Behavioral: Negative for depression. The patient is not nervous/anxious and does not have insomnia.      VITAL SIGNS:   Vitals:   05/12/18 2315 05/12/18 2330 05/13/18 0000 05/13/18 0015  BP:  119/87 (!) 93/57   Pulse: (!) 125   (!) 117  Resp: 12  17   Temp:      TempSrc:      SpO2: 95%   94%  Weight:      Height:       Wt Readings from Last 3 Encounters:  05/12/18 79.8 kg    PHYSICAL EXAMINATION:  Physical Exam  Vitals reviewed. Constitutional: He is oriented to person, place, and time. He appears well-developed and well-nourished. No distress.  HENT:  Head: Normocephalic and atraumatic.  Mouth/Throat: Oropharynx is clear and moist.  Eyes: Pupils are equal, round, and reactive to light. Conjunctivae and EOM are normal. No scleral icterus.  Neck: Normal range of motion. Neck  supple. No JVD present. No thyromegaly present.  Cardiovascular: Regular rhythm and intact distal pulses. Exam reveals no gallop and no friction rub.  No murmur heard. Tachycardic  Respiratory: Effort normal. No respiratory distress. He has no wheezes. He has no rales.  Rhonchi  GI: Soft. Bowel sounds are normal. He exhibits no distension. There is no abdominal tenderness.  Musculoskeletal: Normal range of motion.        General: No edema.     Comments: No arthritis, no gout  Lymphadenopathy:    He has no cervical adenopathy.  Neurological: He is alert and oriented to person,  place, and time. No cranial nerve deficit.  No dysarthria, no aphasia  Skin: Skin is warm and dry. No rash noted. No erythema.  Psychiatric: He has a normal mood and affect. His behavior is normal. Judgment and thought content normal.    LABORATORY PANEL:   CBC Recent Labs  Lab 05/12/18 2105  WBC 21.3*  HGB 16.7  HCT 46.0  PLT 221   ------------------------------------------------------------------------------------------------------------------  Chemistries  Recent Labs  Lab 05/12/18 2105  NA 143  K 4.4  CL 106  CO2 23  GLUCOSE 111*  BUN 14  CREATININE 1.20  CALCIUM 8.8*  AST 40  ALT 30  ALKPHOS 57  BILITOT 0.8   ------------------------------------------------------------------------------------------------------------------  Cardiac Enzymes Recent Labs  Lab 05/12/18 2105  TROPONINI 0.12*   ------------------------------------------------------------------------------------------------------------------  RADIOLOGY:  Dg Chest Portable 1 View  Result Date: 05/12/2018 CLINICAL DATA:  Overdose, shortness of breath EXAM: PORTABLE CHEST 1 VIEW COMPARISON:  12/26/2009 FINDINGS: Patchy airspace disease throughout the lungs. Heart is normal size. No effusions. No acute bony abnormality. IMPRESSION: Patchy ill-defined bilateral airspace disease. This could reflect noncardiogenic edema, less likely infection. Electronically Signed   By: Charlett Nose M.D.   On: 05/12/2018 21:09    EKG:   Orders placed or performed during the hospital encounter of 05/12/18  . ED EKG  . ED EKG    IMPRESSION AND PLAN:  Principal Problem:   Sepsis (HCC) -due to aspiration event, lactic acid mildly elevated, we will continue IV fluids and recheck lactic acid until within normal limits, blood pressure stable, culture sent from the ED Active Problems:   Narcotic overdose [facility]) -discussed with patient how close he came to dying and the importance of seeking help for his substance abuse.   Patient states that he is interested in rehab and states that his parents may be securing this for him.   Aspiration pneumonia (HCC) -IV antibiotics, supportive treatment   Elevated troponin -this is likely related to his episode of hypoxia.  However, we will trend his cardiac enzymes and get an echocardiogram.  Cardiology consult could be considered if his tests show any significant abnormality  Chart review performed and case discussed with ED provider. Labs, imaging and/or ECG reviewed by provider and discussed with patient/family. Management plans discussed with the patient and/or family.  COVID-19 status: Tested negative     DVT PROPHYLAXIS: SubQ lovenox   GI PROPHYLAXIS:  None  ADMISSION STATUS: Inpatient     CODE STATUS: Full  TOTAL TIME TAKING CARE OF THIS PATIENT: 45 minutes.   This patient was evaluated in the context of the global COVID-19 pandemic, which necessitated consideration that the patient might be at risk for infection with the SARS-CoV-2 virus that causes COVID-19. Institutional protocols and algorithms that pertain to the evaluation of patients at risk for COVID-19 are in a state of rapid change based on information released  by regulatory bodies including the CDC and federal and state organizations. These policies and algorithms were followed to the best of this provider's knowledge to date during the patient's care at this facility.  Barney DrainDavid F Denny Mccree 05/13/2018, 12:25 AM  [facility]  Office  [phone]  CC: Primary care physician; Dorothey BasemanBronstein, Cobe Viney, MD  Note:  This document was prepared using Dragon voice recognition software and may include unintentional dictation errors.

## 2018-05-13 NOTE — Progress Notes (Signed)
Pharmacy Antibiotic Note  OSIAH HYE is a 28 y.o. male admitted on 05/12/2018 with aspiration pneumonia s/t drug overdose.  Pharmacy has been consulted for Unasyn dosing.  Plan: Will start patient on Unasyn 3g IV q6h per CrCl > 30 ml/min. Will continue to monitor s/sx and resolution of infx as well as renal function.  Height: 5\' 10"  (177.8 cm) Weight: 176 lb (79.8 kg) IBW/kg (Calculated) : 73  Temp (24hrs), Avg:98.2 F (36.8 C), Min:98.2 F (36.8 C), Max:98.2 F (36.8 C)  Recent Labs  Lab 05/12/18 2105 05/12/18 2229  WBC 21.3*  --   CREATININE 1.20  --   LATICACIDVEN  --  2.1*    Estimated Creatinine Clearance: 97.2 mL/min (by C-G formula based on SCr of 1.2 mg/dL).    No Known Allergies  Thank you for allowing pharmacy to be a part of this patient's care.  Thomasene Ripple, PharmD, BCPS Clinical Pharmacist 05/13/2018

## 2018-05-13 NOTE — ED Notes (Signed)
Room temp turned down as pt states he is hot. Pt given another water.

## 2018-05-13 NOTE — TOC Initial Note (Signed)
Transition of Care [facility]) - Initial/Assessment Note    Patient Details  Name: Miguel Fischer MRN: [id number] Date of Birth: October 03, 1990  Transition of [facility]) CM/SW Contact:    Miguel Kerns, RN Phone Number: 05/13/2018, 4:49 PM  Clinical Narrative:       admitted with over dose of percocet.  To patient room for assessment as SW consult placed.  Patient is A&O x4; very appropriate and respectful.  He states he would like to go to [facility] at discharge.  Had patient sign medical release form and he gave permission for this RNCM to contact [facility] and his mother Miguel Fischer.  Spoke with Miguel Fischer in admissions.  Faxed medical release form and medical [phone] .  Miguel Fischer  has already spoke with patient and is Psychiatrist.  Spoke with Miguel Fischer (mother) [phone] and she states she will be able to transport him when discharged.  Will continue to assist with DC planning.            Expected Discharge Plan: IP Rehab Facility Barriers to Discharge: Continued Medical Work up   Patient Goals and CMS Choice        Expected Discharge Plan and Services Expected Discharge Plan: IP Rehab Facility   Discharge Planning Services: CM Consult   Living arrangements for the past 2 months: Apartment                                      Prior Living Arrangements/Services Living arrangements for the past 2 months: Apartment Lives with:: Significant Other Patient language and need for interpreter reviewed:: Yes              Criminal Activity/Legal Involvement Pertinent to Current Situation/Hospitalization: No - Comment as needed  Activities of Daily Living Home Assistive Devices/Equipment: Contact lenses ADL Screening (condition at time of admission) Patient's cognitive ability adequate to safely complete daily activities?: Yes Is the patient deaf or have difficulty hearing?: No Does the patient have difficulty seeing, even when wearing  glasses/contacts?: No Does the patient have difficulty concentrating, remembering, or making decisions?: No Patient able to express need for assistance with ADLs?: Yes Does the patient have difficulty dressing or bathing?: No Independently performs ADLs?: Yes (appropriate for developmental age) Does the patient have difficulty walking or climbing stairs?: No Weakness of Legs: Right Weakness of Arms/Hands: None  Permission Sought/Granted Permission sought to share information with : Photographer granted to share info w AGENCY: [facility]        Emotional Assessment Appearance:: Appears stated age Attitude/Demeanor/Rapport: Gracious Affect (typically observed): Accepting Orientation: : Oriented to Self, Oriented to Place, Oriented to  Time, Oriented to Situation Alcohol / Substance Use: Illicit Drugs    Admission diagnosis:  Opiate overdose, accidental or unintentional, initial encounter (HCC) [T40.601A] Aspiration pneumonia, unspecified aspiration pneumonia type, unspecified laterality, unspecified part of lung (HCC) [J69.0] Patient Active Problem List   Diagnosis Date Noted  . Elevated troponin 05/13/2018  . Narcotic overdose (HCC) 05/12/2018  . Sepsis (HCC) 05/12/2018  . Aspiration pneumonia (HCC) 05/12/2018   PCP:  Dorothey Baseman, MD Pharmacy:   [facility] [phone] - [place], [place] - [address] [place] [address] Phone: [phone] Fax: [phone]     Social Determinants of Health (SDOH) Interventions    Readmission Risk Interventions No flowsheet data found.

## 2018-05-13 NOTE — ED Notes (Signed)
Pt accidentally hit call bell. Denies any needs.

## 2018-05-14 LAB — CBC
HCT: 37.9 % — ABNORMAL LOW (ref 39.0–52.0)
Hemoglobin: 13.5 g/dL (ref 13.0–17.0)
MCH: 32.2 pg (ref 26.0–34.0)
MCHC: 35.6 g/dL (ref 30.0–36.0)
MCV: 90.5 fL (ref 80.0–100.0)
Platelets: 174 10*3/uL (ref 150–400)
RBC: 4.19 MIL/uL — ABNORMAL LOW (ref 4.22–5.81)
RDW: 11.7 % (ref 11.5–15.5)
WBC: 6.6 10*3/uL (ref 4.0–10.5)
nRBC: 0 % (ref 0.0–0.2)

## 2018-05-14 LAB — HIV ANTIBODY (ROUTINE TESTING W REFLEX): HIV Screen 4th Generation wRfx: NONREACTIVE

## 2018-05-14 MED ORDER — SERTRALINE HCL 100 MG PO TABS: 100.0000 mg | ORAL_TABLET | Freq: Every day | ORAL | Status: AC

## 2018-05-14 NOTE — TOC Transition Note (Signed)
Transition of Care [facility]) - CM/SW Discharge Note   Patient Details  Name: Miguel Fischer MRN: [id number] Date of Birth: 03-09-1990  Transition of Care [facility]) CM/SW Contact:  Sherren Kerns, RN Phone Number: 05/14/2018, 1:32 PM   Clinical Narrative:   Sherron Monday with Josh at [facility] in admissions. Everything is set up for patient to be admitted to [facility] this evening or tomorrow morning once the medical director there reviews.  [name], mother will transport home today.    Final next level of care: Home/Self Care Barriers to Discharge: No Barriers Identified   Patient Goals and CMS Choice        Discharge Placement                       Discharge Plan and Services   Discharge Planning Services: CM Consult                                 Social Determinants of Health (SDOH) Interventions     Readmission Risk Interventions No flowsheet data found.

## 2018-05-14 NOTE — Discharge Summary (Signed)
[facility] NAME: Miguel Fischer    MR#:  [id number]  DATE OF BIRTH:  1990-02-13  DATE OF ADMISSION:  05/12/2018 ADMITTING PHYSICIAN: Lance Coon, MD  DATE OF DISCHARGE: 05/14/2018  PRIMARY CARE PHYSICIAN: Juluis Pitch, MD    ADMISSION DIAGNOSIS:  Opiate overdose, accidental or unintentional, initial encounter ([place]) [T40.601A] Aspiration pneumonia, unspecified aspiration pneumonia type, unspecified laterality, unspecified part of lung ([place]) [J69.0]  DISCHARGE DIAGNOSIS:  Principal Problem:   Sepsis ([place]) Active Problems:   Narcotic overdose ([place])   Aspiration pneumonia ([place])   Elevated troponin   SECONDARY DIAGNOSIS:   Past Medical History:  Diagnosis Date  . Anxiety   . Depression     HOSPITAL COURSE:   28 year old male with past medical history of depression, substance abuse who presented to the hospital due to altered mental status and suspected sepsis.  1.  Altered mental status/encephalopathy- secondary to substance abuse.  Patient apparently had snorted too much Percocet and became unresponsive.  Patient was given Narcan. - Mental status much improved and is now back to baseline.  2.  Sepsis-patient met criteria on admission given leukocytosis, elevated lactic acid and possible aspiration pneumonia on chest x-ray. Patient was empirically given IV Unasyn.  Blood cultures have remained negative.  Sepsis has not been ruled out.  Patient is not being discharged on antibiotics.  His white cell count is normalized, he is afebrile and hemodynamically stable with a normal lactate level now.  3.  Aspiration pneumonia- initially thought to be the source of patient's sepsis.  This has been ruled out now.  Empirically patient was given IV Unasyn but presently patient is being discharged on no antibiotics.  This was likely aspiration pneumonitis with no clinical evidence of pneumonia.   4.  Elevated troponin- suspected to  be secondary to demand ischemia from patient receiving CPR while he was unresponsive. - Echocardiogram done showed normal ejection fraction with no acute wall motion abnormalities.  Patient is clinically stable for discharge.  DISCHARGE CONDITIONS:   Stable  CONSULTS OBTAINED:    DRUG ALLERGIES:  No Known Allergies  DISCHARGE MEDICATIONS:   Allergies as of 05/14/2018   No Known Allergies     Medication List    TAKE these medications   sertraline 100 MG tablet Commonly known as:  ZOLOFT Take 1 tablet (100 mg total) by mouth daily.         DISCHARGE INSTRUCTIONS:   DIET:  Regular diet  DISCHARGE CONDITION:  Stable  ACTIVITY:  Activity as tolerated  OXYGEN:  Home Oxygen: No.   Oxygen Delivery: room air  DISCHARGE LOCATION:  Rehab facility   If you experience worsening of your admission symptoms, develop shortness of breath, life threatening emergency, suicidal or homicidal thoughts you must seek medical attention immediately by calling 911 or calling your MD immediately  if symptoms less severe.  You Must read complete instructions/literature along with all the possible adverse reactions/side effects for all the Medicines you take and that have been prescribed to you. Take any new Medicines after you have completely understood and accpet all the possible adverse reactions/side effects.   Please note  You were cared for by a hospitalist during your hospital stay. If you have any questions about your discharge medications or the care you received while you were in the hospital after you are discharged, you can call the unit and asked to speak with the hospitalist on call if the hospitalist that took  care of you is not available. Once you are discharged, your primary care physician will handle any further medical issues. Please note that NO REFILLS for any discharge medications will be authorized once you are discharged, as it is imperative that you return to your  primary care physician (or establish a relationship with a primary care physician if you do not have one) for your aftercare needs so that they can reassess your need for medications and monitor your lab values.     Today   No acute events overnight.  No fever, hemodynamically stable.  Denies any acute complaints.  Will discharge to a opioid detox facility today.  VITAL SIGNS:  Blood pressure 129/82, pulse 80, temperature 97.7 F (36.5 C), temperature source Oral, resp. rate 20, height _0  (1.778 m), weight 80.5 kg, SpO2 99 %.  I/O:    Intake/Output Summary (Last 24 hours) at 05/14/2018 1021 Last data filed at 05/14/2018 0555 Gross per 24 hour  Intake 101.06 ml  Output 2750 ml  Net -2648.94 ml    PHYSICAL EXAMINATION:  GENERAL:  28 y.o.-year-old patient lying in the bed with no acute distress.  EYES: Pupils equal, round, reactive to light and accommodation. No scleral icterus. Extraocular muscles intact.  HEENT: Head atraumatic, normocephalic. Oropharynx and nasopharynx clear.  NECK:  Supple, no jugular venous distention. No thyroid enlargement, no tenderness.  LUNGS: Normal breath sounds bilaterally, no wheezing, rales,rhonchi. No use of accessory muscles of respiration.  CARDIOVASCULAR: S1, S2 normal. No murmurs, rubs, or gallops.  ABDOMEN: Soft, non-tender, non-distended. Bowel sounds present. No organomegaly or mass.  EXTREMITIES: No pedal edema, cyanosis, or clubbing.  NEUROLOGIC: Cranial nerves II through XII are intact. No focal motor or sensory defecits b/l.  PSYCHIATRIC: The patient is alert and oriented x 3.  SKIN: No obvious rash, lesion, or ulcer.   DATA REVIEW:   CBC Recent Labs  Lab 05/14/18 0437  WBC 6.6  HGB 13.5  HCT 37.9*  PLT 174    Chemistries  Recent Labs  Lab 05/12/18 2105 05/13/18 0231  NA 143 138  K 4.4 4.5  CL 106 107  CO2 23 24  GLUCOSE 111* 99  BUN 14 14  CREATININE 1.20 0.97  CALCIUM 8.8* 7.9*  AST 40  --   ALT 30  --    ALKPHOS 57  --   BILITOT 0.8  --     Cardiac Enzymes Recent Labs  Lab 05/13/18 0918  TROPONINI 0.21*    Microbiology Results  Results for orders placed or performed during the hospital encounter of 05/12/18  SARS Coronavirus 2 (CEPHEID - Performed in [place] [facility] hospital lab), Hosp Order     Status: None   Collection Time: 05/12/18  9:05 PM  Result Value Ref Range Status   SARS Coronavirus 2 NEGATIVE NEGATIVE Final    Comment: (NOTE) If result is NEGATIVE SARS-CoV-2 target nucleic acids are NOT DETECTED. The SARS-CoV-2 RNA is generally detectable in upper and lower  respiratory specimens during the acute phase of infection. The lowest  concentration of SARS-CoV-2 viral copies this assay can detect is 250  copies / mL. A negative result does not preclude SARS-CoV-2 infection  and should not be used as the sole basis for treatment or other  patient management decisions.  A negative result may occur with  improper specimen collection / handling, submission of specimen other  than nasopharyngeal swab, presence of viral mutation(s) within the  areas targeted by this assay, and inadequate number of  viral copies  (<250 copies / mL). A negative result must be combined with clinical  observations, patient history, and epidemiological information. If result is POSITIVE SARS-CoV-2 target nucleic acids are DETECTED. The SARS-CoV-2 RNA is generally detectable in upper and lower  respiratory specimens dur ing the acute phase of infection.  Positive  results are indicative of active infection with SARS-CoV-2.  Clinical  correlation with patient history and other diagnostic information is  necessary to determine patient infection status.  Positive results do  not rule out bacterial infection or co-infection with other viruses. If result is PRESUMPTIVE POSTIVE SARS-CoV-2 nucleic acids MAY BE PRESENT.   A presumptive positive result was obtained on the submitted specimen  and confirmed on  repeat testing.  While 2019 novel coronavirus  (SARS-CoV-2) nucleic acids may be present in the submitted sample  additional confirmatory testing may be necessary for epidemiological  and / or clinical management purposes  to differentiate between  SARS-CoV-2 and other Sarbecovirus currently known to infect humans.  If clinically indicated additional testing with an alternate test  methodology [phone]) is advised. The SARS-CoV-2 RNA is generally  detectable in upper and lower respiratory sp ecimens during the acute  phase of infection. The expected result is Negative. Fact Sheet for Patients:  StrictlyIdeas.no Fact Sheet for Healthcare Providers: BankingDealers.co.za This test is not yet approved or cleared by the [place] FDA and has been authorized for detection and/or diagnosis of SARS-CoV-2 by FDA under an Emergency Use Authorization (EUA).  This EUA will remain in effect (meaning this test can be used) for the duration of the COVID-19 declaration under Section 564(b)(1) of the Act, 21 U.S.C. section 360bbb-3(b)(1), unless the authorization is terminated or revoked sooner. Performed at [facility], [address] [place], [place] [address]   Blood culture (routine x 2)     Status: None (Preliminary result)   Collection Time: 05/12/18 10:29 PM  Result Value Ref Range Status   Specimen Description BLOOD LEFT ANTECUBITAL  Final   Special Requests   Final    BOTTLES DRAWN AEROBIC AND ANAEROBIC Blood Culture adequate volume   Culture   Final    NO GROWTH 2 DAYS Performed at [facility], [address], [place], [place] [address]    Report Status PENDING  Incomplete  Blood culture (routine x 2)     Status: None (Preliminary result)   Collection Time: 05/12/18 10:29 PM  Result Value Ref Range Status   Specimen Description BLOOD BLOOD RIGHT HAND  Final   Special Requests   Final    BOTTLES DRAWN AEROBIC AND  ANAEROBIC Blood Culture adequate volume   Culture   Final    NO GROWTH 2 DAYS Performed at [facility], [address] [place], [place] [address]    Report Status PENDING  Incomplete    RADIOLOGY:  Dg Chest Portable 1 View  Result Date: 05/12/2018 CLINICAL DATA:  Overdose, shortness of breath EXAM: PORTABLE CHEST 1 VIEW COMPARISON:  12/26/2009 FINDINGS: Patchy airspace disease throughout the lungs. Heart is normal size. No effusions. No acute bony abnormality. IMPRESSION: Patchy ill-defined bilateral airspace disease. This could reflect noncardiogenic edema, less likely infection. Electronically Signed   By: Rolm Baptise M.D.   On: 05/12/2018 21:09      Management plans discussed with the patient, family and they are in agreement.  CODE STATUS:     Code Status Orders  (From admission, onward)         Start  Ordered   05/13/18 0119  Full code  Continuous     05/13/18 0119        Code Status History    This patient has a current code status but no historical code status.      TOTAL TIME TAKING CARE OF THIS PATIENT: 45 minutes.    Henreitta Leber M.D on 05/14/2018 at 10:21 AM  Between 7am to 6pm - Pager - [phone]  After 6pm go to [email or web address] - Proofreader  [facility]  Office  [phone]  CC: Primary care physician; Juluis Pitch, MD

## 2018-05-17 LAB — CULTURE, BLOOD (ROUTINE X 2)
Culture: NO GROWTH
Culture: NO GROWTH
Special Requests: ADEQUATE
Special Requests: ADEQUATE

## 2018-08-17 ENCOUNTER — Encounter (HOSPITAL_COMMUNITY): Payer: Self-pay | Admitting: Emergency Medicine

## 2018-08-17 ENCOUNTER — Ambulatory Visit (HOSPITAL_COMMUNITY)
Admission: EM | Admit: 2018-08-17 | Discharge: 2018-08-17 | Disposition: A | Discharge status: Home / Self Care | Payer: Commercial Managed Care - PPO | Attending: Family Medicine | Admitting: Family Medicine

## 2018-08-17 ENCOUNTER — Other Ambulatory Visit: Payer: Self-pay

## 2018-08-17 DIAGNOSIS — Z20828 Contact with and (suspected) exposure to other viral communicable diseases: Secondary | ICD-10-CM | POA: Insufficient documentation

## 2018-08-17 DIAGNOSIS — J029 Acute pharyngitis, unspecified: Secondary | ICD-10-CM | POA: Diagnosis not present

## 2018-08-17 DIAGNOSIS — Z20822 Contact with and (suspected) exposure to covid-19: Secondary | ICD-10-CM

## 2018-08-17 NOTE — ED Provider Notes (Signed)
[facility]    CSN: [id number] Arrival date & time: 08/17/18  1640      History   Chief Complaint Chief Complaint  Patient presents with  . Sore Throat    HPI Miguel Fischer is a 28 y.o. male.   HPI  Patient is recovering from opiate addiction.  He is living in a group home.  He states that 1 of his roommates has been exposed to coronavirus at work.  He is here with his 2 other roommates, the 3 of them all want coronavirus testing.  He does have a mild sore throat.  No runny nose, no headache, no fever chills, no body aches  Past Medical History:  Diagnosis Date  . Anxiety   . Depression     Patient Active Problem List   Diagnosis Date Noted  . Elevated troponin 05/13/2018  . Narcotic overdose (HCC) 05/12/2018  . Sepsis (HCC) 05/12/2018  . Aspiration pneumonia (HCC) 05/12/2018    Past Surgical History:  Procedure Laterality Date  . UPPER GI ENDOSCOPY         Home Medications    Prior to Admission medications   Medication Sig Start Date End Date Taking? Authorizing Provider  sertraline (ZOLOFT) 100 MG tablet Take 1 tablet (100 mg total) by mouth daily. 05/14/18   [place] SirenSainani, Vivek J, MD    Family History Family History  Problem Relation Age of Onset  . Depression Mother   . Crohn's disease Father   . Depression Sister   . Diabetes Sister     Social History Social History   Tobacco Use  . Smoking status: Never Smoker  . Smokeless tobacco: Never Used  Substance Use Topics  . Alcohol use: Not on file  . Drug use: Not Currently    Types: Oxycodone    Comment: hx of opiod abuse     Allergies   Patient has no known allergies.   Review of Systems Review of Systems  Constitutional: Negative for chills, fatigue and fever.  HENT: Positive for sore throat. Negative for ear pain.   Eyes: Negative for pain and visual disturbance.  Respiratory: Negative for cough and shortness of breath.   Cardiovascular: Negative for chest pain and  palpitations.  Gastrointestinal: Negative for abdominal pain and vomiting.  Genitourinary: Negative for dysuria and hematuria.  Musculoskeletal: Negative for arthralgias, back pain and myalgias.  Skin: Negative for color change and rash.  Neurological: Negative for seizures, syncope and headaches.  All other systems reviewed and are negative.    Physical Exam Triage Vital Signs ED Triage Vitals [08/17/18 1731]  Enc Vitals Group     BP 129/75     Pulse Rate 97     Resp 18     Temp 98.1 F (36.7 C)     Temp Source Temporal     SpO2 97 %     Weight      Height      Head Circumference      Peak Flow      Pain Score 2     Pain Loc      Pain Edu?      Excl. in GC?    No data found.  Updated Vital Signs BP 129/75 (BP Location: Right Arm)   Pulse 97   Temp 98.1 F (36.7 C) (Temporal)   Resp 18   SpO2 97%   Visual Acuity Right Eye Distance:   Left Eye Distance:   Bilateral Distance:  Right Eye Near:   Left Eye Near:    Bilateral Near:     Physical Exam Constitutional:      General: He is not in acute distress.    Appearance: He is well-developed.  HENT:     Head: Normocephalic and atraumatic.     Mouth/Throat:     Pharynx: Posterior oropharyngeal erythema present.     Tonsils: 0 on the right. 0 on the left.     Comments: Mild injection posterior pharynx. Eyes:     Conjunctiva/sclera: Conjunctivae normal.     Pupils: Pupils are equal, round, and reactive to light.  Neck:     Musculoskeletal: Normal range of motion.  Cardiovascular:     Rate and Rhythm: Normal rate.  Pulmonary:     Effort: Pulmonary effort is normal. No respiratory distress.  Abdominal:     General: There is no distension.     Palpations: Abdomen is soft.  Musculoskeletal: Normal range of motion.  Lymphadenopathy:     Cervical: No cervical adenopathy.  Skin:    General: Skin is warm and dry.  Neurological:     Mental Status: He is alert.  Psychiatric:        Mood and Affect: Mood  normal.        Behavior: Behavior normal.      UC Treatments / Results  Labs (all labs ordered are listed, but only abnormal results are displayed) Labs Reviewed  NOVEL CORONAVIRUS, NAA (HOSPITAL ORDER, SEND-OUT TO REF LAB)    EKG   Radiology No results found.  Procedures Procedures (including critical care time)  Medications Ordered in UC Medications - No data to display  Initial Impression / Assessment and Plan / UC Course  I have reviewed the triage vital signs and the nursing notes.  Pertinent labs & imaging results that were available during my care of the patient were reviewed by me and considered in my medical decision making (see chart for details).     Possible exposure to coronavirus.  Discussed Final Clinical Impressions(s) / UC Diagnoses   Final diagnoses:  Pharyngitis, unspecified etiology  Exposure to Covid-19 Virus     Discharge Instructions     Drink plenty of fluids Tylenol for pain or fever You should quarantine until your coronavirus test is available     Person Under Monitoring Name: Miguel Fischer  Location[address] [place] [id number]   Infection Prevention Recommendations for Individuals Confirmed to have, or Being Evaluated for, 2019 Novel Coronavirus (COVID-19) Infection Who Receive Care at Home  Individuals who are confirmed to have, or are being evaluated for, COVID-19 should follow the prevention steps below until a healthcare provider or local or state health department says they can return to normal activities.  Stay home except to get medical care You should restrict activities outside your home, except for getting medical care. Do not go to work, school, or public areas, and do not use public transportation or taxis.  Call ahead before visiting your doctor Before your medical appointment, call the healthcare provider and tell them that you have, or are being evaluated for, COVID-19 infection. This will help  the healthcare provider's office take steps to keep other people from getting infected. Ask your healthcare provider to call the local or state health department.  Monitor your symptoms Seek prompt medical attention if your illness is worsening (e.g., difficulty breathing). Before going to your medical appointment, call the healthcare provider and tell them that you  have, or are being evaluated for, COVID-19 infection. Ask your healthcare provider to call the local or state health department.  Wear a facemask You should wear a facemask that covers your nose and mouth when you are in the same room with other people and when you visit a healthcare provider. People who live with or visit you should also wear a facemask while they are in the same room with you.  Separate yourself from other people in your home As much as possible, you should stay in a different room from other people in your home. Also, you should use a separate bathroom, if available.  Avoid sharing household items You should not share dishes, drinking glasses, cups, eating utensils, towels, bedding, or other items with other people in your home. After using these items, you should wash them thoroughly with soap and water.  Cover your coughs and sneezes Cover your mouth and nose with a tissue when you cough or sneeze, or you can cough or sneeze into your sleeve. Throw used tissues in a lined trash can, and immediately wash your hands with soap and water for at least 20 seconds or use an alcohol-based hand rub.  Wash your [employer] your hands often and thoroughly with soap and water for at least 20 seconds. You can use an alcohol-based hand sanitizer if soap and water are not available and if your hands are not visibly dirty. Avoid touching your eyes, nose, and mouth with unwashed hands.   Prevention Steps for Caregivers and Household Members of Individuals Confirmed to have, or Being Evaluated for, COVID-19 Infection  Being Cared for in the Home  If you live with, or provide care at home for, a person confirmed to have, or being evaluated for, COVID-19 infection please follow these guidelines to prevent infection:  Follow healthcare provider's instructions Make sure that you understand and can help the patient follow any healthcare provider instructions for all care.  Provide for the patient's basic needs You should help the patient with basic needs in the home and provide support for getting groceries, prescriptions, and other personal needs.  Monitor the patient's symptoms If they are getting sicker, call his or her medical provider and tell them that the patient has, or is being evaluated for, COVID-19 infection. This will help the healthcare provider's office take steps to keep other people from getting infected. Ask the healthcare provider to call the local or state health department.  Limit the number of people who have contact with the patient  If possible, have only one caregiver for the patient.  Other household members should stay in another home or place of residence. If this is not possible, they should stay  in another room, or be separated from the patient as much as possible. Use a separate bathroom, if available.  Restrict visitors who do not have an essential need to be in the home.  Keep older adults, very young children, and other sick people away from the patient Keep older adults, very young children, and those who have compromised immune systems or chronic health conditions away from the patient. This includes people with chronic heart, lung, or kidney conditions, diabetes, and cancer.  Ensure good ventilation Make sure that shared spaces in the home have good air flow, such as from an air conditioner or an opened window, weather permitting.  Wash your hands often  Wash your hands often and thoroughly with soap and water for at least 20 seconds. You can use an  alcohol  based hand sanitizer if soap and water are not available and if your hands are not visibly dirty.  Avoid touching your eyes, nose, and mouth with unwashed hands.  Use disposable paper towels to dry your hands. If not available, use dedicated cloth towels and replace them when they become wet.  Wear a facemask and gloves  Wear a disposable facemask at all times in the room and gloves when you touch or have contact with the patient's blood, body fluids, and/or secretions or excretions, such as sweat, saliva, sputum, nasal mucus, vomit, urine, or feces.  Ensure the mask fits over your nose and mouth tightly, and do not touch it during use.  Throw out disposable facemasks and gloves after using them. Do not reuse.  Wash your hands immediately after removing your facemask and gloves.  If your personal clothing becomes contaminated, carefully remove clothing and launder. Wash your hands after handling contaminated clothing.  Place all used disposable facemasks, gloves, and other waste in a lined container before disposing them with other household waste.  Remove gloves and wash your hands immediately after handling these items.  Do not share dishes, glasses, or other household items with the patient  Avoid sharing household items. You should not share dishes, drinking glasses, cups, eating utensils, towels, bedding, or other items with a patient who is confirmed to have, or being evaluated for, COVID-19 infection.  After the person uses these items, you should wash them thoroughly with soap and water.  Wash laundry thoroughly  Immediately remove and wash clothes or bedding that have blood, body fluids, and/or secretions or excretions, such as sweat, saliva, sputum, nasal mucus, vomit, urine, or feces, on them.  Wear gloves when handling laundry from the patient.  Read and follow directions on labels of laundry or clothing items and detergent. In general, wash and dry with the warmest  temperatures recommended on the label.  Clean all areas the individual has used often  Clean all touchable surfaces, such as counters, tabletops, doorknobs, bathroom fixtures, toilets, phones, keyboards, tablets, and bedside tables, every day. Also, clean any surfaces that may have blood, body fluids, and/or secretions or excretions on them.  Wear gloves when cleaning surfaces the patient has come in contact with.  Use a diluted bleach solution (e.g., dilute bleach with 1 part bleach and 10 parts water) or a household disinfectant with a label that says EPA-registered for coronaviruses. To make a bleach solution at home, add 1 tablespoon of bleach to 1 quart (4 cups) of water. For a larger supply, add  cup of bleach to 1 gallon (16 cups) of water.  Read labels of cleaning products and follow recommendations provided on product labels. Labels contain instructions for safe and effective use of the cleaning product including precautions you should take when applying the product, such as wearing gloves or eye protection and making sure you have good ventilation during use of the product.  Remove gloves and wash hands immediately after cleaning.  Monitor yourself for signs and symptoms of illness Caregivers and household members are considered close contacts, should monitor their health, and will be asked to limit movement outside of the home to the extent possible. Follow the monitoring steps for close contacts listed on the symptom monitoring form.   ? If you have additional questions, contact your local health department or call the epidemiologist on call at [phone] (available 24/7). ? This guidance is subject to change. For the most up-to-date guidance from [facility], please  refer to their website: [email or web address].pl    ED Prescriptions    None     Controlled Substance Prescriptions [place] Controlled Substance Registry consulted? Not  Applicable   Raylene Everts, MD 08/17/18 2030

## 2018-08-17 NOTE — ED Triage Notes (Signed)
Pt here for sore throat and would like covid testing

## 2018-08-17 NOTE — Discharge Instructions (Signed)
Drink plenty of fluids Tylenol for pain or fever You should quarantine until your coronavirus test is available     Person Under Monitoring Name: Miguel Fischer  Location[address] [place] [id number]   Infection Prevention Recommendations for Individuals Confirmed to have, or Being Evaluated for, 2019 Novel Coronavirus (COVID-19) Infection Who Receive Care at Home  Individuals who are confirmed to have, or are being evaluated for, COVID-19 should follow the prevention steps below until a healthcare provider or local or state health department says they can return to normal activities.  Stay home except to get medical care You should restrict activities outside your home, except for getting medical care. Do not go to work, school, or public areas, and do not use public transportation or taxis.  Call ahead before visiting your doctor Before your medical appointment, call the healthcare provider and tell them that you have, or are being evaluated for, COVID-19 infection. This will help the healthcare providers office take steps to keep other people from getting infected. Ask your healthcare provider to call the local or state health department.  Monitor your symptoms Seek prompt medical attention if your illness is worsening (e.g., difficulty breathing). Before going to your medical appointment, call the healthcare provider and tell them that you have, or are being evaluated for, COVID-19 infection. Ask your healthcare provider to call the local or state health department.  Wear a facemask You should wear a facemask that covers your nose and mouth when you are in the same room with other people and when you visit a healthcare provider. People who live with or visit you should also wear a facemask while they are in the same room with you.  Separate yourself from other people in your home As much as possible, you should stay in a different room from other people in your  home. Also, you should use a separate bathroom, if available.  Avoid sharing household items You should not share dishes, drinking glasses, cups, eating utensils, towels, bedding, or other items with other people in your home. After using these items, you should wash them thoroughly with soap and water.  Cover your coughs and sneezes Cover your mouth and nose with a tissue when you cough or sneeze, or you can cough or sneeze into your sleeve. Throw used tissues in a lined trash can, and immediately wash your hands with soap and water for at least 20 seconds or use an alcohol-based hand rub.  Wash your [employer] Wash your hands often and thoroughly with soap and water for at least 20 seconds. You can use an alcohol-based hand sanitizer if soap and water are not available and if your hands are not visibly dirty. Avoid touching your eyes, nose, and mouth with unwashed hands.   Prevention Steps for Caregivers and Household Members of Individuals Confirmed to have, or Being Evaluated for, COVID-19 Infection Being Cared for in the Home  If you live with, or provide care at home for, a person confirmed to have, or being evaluated for, COVID-19 infection please follow these guidelines to prevent infection:  Follow healthcare providers instructions Make sure that you understand and can help the patient follow any healthcare provider instructions for all care.  Provide for the patients basic needs You should help the patient with basic needs in the home and provide support for getting groceries, prescriptions, and other personal needs.  Monitor the patients symptoms If they are getting sicker, call his or her medical provider  and tell them that the patient has, or is being evaluated for, COVID-19 infection. This will help the healthcare providers office take steps to keep other people from getting infected. Ask the healthcare provider to call the local or state health department.  Limit the  number of people who have contact with the patient If possible, have only one caregiver for the patient. Other household members should stay in another home or place of residence. If this is not possible, they should stay in another room, or be separated from the patient as much as possible. Use a separate bathroom, if available. Restrict visitors who do not have an essential need to be in the home.  Keep older adults, very young children, and other sick people away from the patient Keep older adults, very young children, and those who have compromised immune systems or chronic health conditions away from the patient. This includes people with chronic heart, lung, or kidney conditions, diabetes, and cancer.  Ensure good ventilation Make sure that shared spaces in the home have good air flow, such as from an air conditioner or an opened window, weather permitting.  Wash your hands often Wash your hands often and thoroughly with soap and water for at least 20 seconds. You can use an alcohol based hand sanitizer if soap and water are not available and if your hands are not visibly dirty. Avoid touching your eyes, nose, and mouth with unwashed hands. Use disposable paper towels to dry your hands. If not available, use dedicated cloth towels and replace them when they become wet.  Wear a facemask and gloves Wear a disposable facemask at all times in the room and gloves when you touch or have contact with the patients blood, body fluids, and/or secretions or excretions, such as sweat, saliva, sputum, nasal mucus, vomit, urine, or feces.  Ensure the mask fits over your nose and mouth tightly, and do not touch it during use. Throw out disposable facemasks and gloves after using them. Do not reuse. Wash your hands immediately after removing your facemask and gloves. If your personal clothing becomes contaminated, carefully remove clothing and launder. Wash your hands after handling contaminated  clothing. Place all used disposable facemasks, gloves, and other waste in a lined container before disposing them with other household waste. Remove gloves and wash your hands immediately after handling these items.  Do not share dishes, glasses, or other household items with the patient Avoid sharing household items. You should not share dishes, drinking glasses, cups, eating utensils, towels, bedding, or other items with a patient who is confirmed to have, or being evaluated for, COVID-19 infection. After the person uses these items, you should wash them thoroughly with soap and water.  Wash laundry thoroughly Immediately remove and wash clothes or bedding that have blood, body fluids, and/or secretions or excretions, such as sweat, saliva, sputum, nasal mucus, vomit, urine, or feces, on them. Wear gloves when handling laundry from the patient. Read and follow directions on labels of laundry or clothing items and detergent. In general, wash and dry with the warmest temperatures recommended on the label.  Clean all areas the individual has used often Clean all touchable surfaces, such as counters, tabletops, doorknobs, bathroom fixtures, toilets, phones, keyboards, tablets, and bedside tables, every day. Also, clean any surfaces that may have blood, body fluids, and/or secretions or excretions on them. Wear gloves when cleaning surfaces the patient has come in contact with. Use a diluted bleach solution (e.g., dilute bleach with 1 part  bleach and 10 parts water) or a household disinfectant with a label that says EPA-registered for coronaviruses. To make a bleach solution at home, add 1 tablespoon of bleach to 1 quart (4 cups) of water. For a larger supply, add  cup of bleach to 1 gallon (16 cups) of water. Read labels of cleaning products and follow recommendations provided on product labels. Labels contain instructions for safe and effective use of the cleaning product including precautions you  should take when applying the product, such as wearing gloves or eye protection and making sure you have good ventilation during use of the product. Remove gloves and wash hands immediately after cleaning.  Monitor yourself for signs and symptoms of illness Caregivers and household members are considered close contacts, should monitor their health, and will be asked to limit movement outside of the home to the extent possible. Follow the monitoring steps for close contacts listed on the symptom monitoring form.   ? If you have additional questions, contact your local health department or call the epidemiologist on call at [phone] (available 24/7). ? This guidance is subject to change. For the most up-to-date guidance from [facility], please refer to their website: TripMetro.hu

## 2018-08-19 LAB — NOVEL CORONAVIRUS, NAA (HOSP ORDER, SEND-OUT TO REF LAB; TAT 18-24 HRS): SARS-CoV-2, NAA: NOT DETECTED

## 2018-08-21 ENCOUNTER — Encounter (HOSPITAL_COMMUNITY): Payer: Self-pay

## 2018-08-31 ENCOUNTER — Ambulatory Visit (HOSPITAL_COMMUNITY)
Admission: EM | Admit: 2018-08-31 | Discharge: 2018-08-31 | Disposition: A | Discharge status: Home / Self Care | Payer: Commercial Managed Care - PPO | Attending: Emergency Medicine | Admitting: Emergency Medicine

## 2018-08-31 ENCOUNTER — Encounter (HOSPITAL_COMMUNITY): Payer: Self-pay

## 2018-08-31 ENCOUNTER — Other Ambulatory Visit: Payer: Self-pay

## 2018-08-31 DIAGNOSIS — Z20822 Contact with and (suspected) exposure to covid-19: Secondary | ICD-10-CM

## 2018-08-31 DIAGNOSIS — Z79899 Other long term (current) drug therapy: Secondary | ICD-10-CM | POA: Insufficient documentation

## 2018-08-31 DIAGNOSIS — Z20828 Contact with and (suspected) exposure to other viral communicable diseases: Secondary | ICD-10-CM | POA: Diagnosis not present

## 2018-08-31 DIAGNOSIS — Z711 Person with feared health complaint in whom no diagnosis is made: Secondary | ICD-10-CM

## 2018-08-31 DIAGNOSIS — F419 Anxiety disorder, unspecified: Secondary | ICD-10-CM | POA: Insufficient documentation

## 2018-08-31 DIAGNOSIS — F329 Major depressive disorder, single episode, unspecified: Secondary | ICD-10-CM | POA: Diagnosis not present

## 2018-08-31 NOTE — ED Triage Notes (Signed)
Pt states she was exposed to Covid and he needs a test before he can go back too work.

## 2018-08-31 NOTE — Discharge Instructions (Addendum)
Your COVID test is pending.  You should self quarantine until your test result is back and is negative.   ° °Go to the emergency department if you develop shortness of breath, high fever, severe diarrhea, or other concerning symptoms.   ° ° ° °

## 2018-08-31 NOTE — ED Provider Notes (Signed)
[facility]    CSN: [id number] Arrival date & time: 08/31/18  1616      History   Chief Complaint Chief Complaint  Patient presents with  . covid test    HPI Miguel Fischer is a 28 y.o. male.   Patient presents with request for a COVID test.  He states he was exposed at home by his roommate who is COVID positive.  Patient was seen here on 08/17/2018 and was COVID negative at that time but has been exposed since.  He denies fever, chills, sore throat, congestion, rhinorrhea, cough, shortness of breath, vomiting, diarrhea, or other symptoms.    The history is provided by the patient.    Past Medical History:  Diagnosis Date  . Anxiety   . Depression     Patient Active Problem List   Diagnosis Date Noted  . Elevated troponin 05/13/2018  . Narcotic overdose ([place]) 05/12/2018  . Sepsis ([place]) 05/12/2018  . Aspiration pneumonia ([place]) 05/12/2018    Past Surgical History:  Procedure Laterality Date  . UPPER GI ENDOSCOPY         Home Medications    Prior to Admission medications   Medication Sig Start Date End Date Taking? Authorizing Provider  sertraline (ZOLOFT) 100 MG tablet Take 1 tablet (100 mg total) by mouth daily. 05/14/18   Henreitta Leber, MD    Family History Family History  Problem Relation Age of Onset  . Depression Mother   . Crohn's disease Father   . Depression Sister   . Diabetes Sister     Social History Social History   Tobacco Use  . Smoking status: Never Smoker  . Smokeless tobacco: Never Used  Substance Use Topics  . Alcohol use: Never    Frequency: Never  . Drug use: Not Currently    Types: Oxycodone    Comment: hx of opiod abuse     Allergies   Patient has no known allergies.   Review of Systems Review of Systems  Constitutional: Negative for chills and fever.  HENT: Negative for congestion, ear pain, rhinorrhea and sore throat.   Eyes: Negative for pain and visual disturbance.  Respiratory: Negative for cough  and shortness of breath.   Cardiovascular: Negative for chest pain and palpitations.  Gastrointestinal: Negative for abdominal pain, diarrhea and vomiting.  Genitourinary: Negative for dysuria and hematuria.  Musculoskeletal: Negative for arthralgias and back pain.  Skin: Negative for color change and rash.  Neurological: Negative for seizures and syncope.  All other systems reviewed and are negative.    Physical Exam Triage Vital Signs ED Triage Vitals  Enc Vitals Group     BP 08/31/18 1659 128/82     Pulse Rate 08/31/18 1659 90     Resp 08/31/18 1659 18     Temp 08/31/18 1659 97.9 F (36.6 C)     Temp Source 08/31/18 1659 Oral     SpO2 08/31/18 1659 100 %     Weight 08/31/18 1657 180 lb (81.6 kg)     Height --      Head Circumference --      Peak Flow --      Pain Score 08/31/18 1657 0     Pain Loc --      Pain Edu? --      Excl. in [place]? --    No data found.  Updated Vital Signs BP 128/82 (BP Location: Right Arm)   Pulse 90   Temp 97.9 F (36.6  C) (Oral)   Resp 18   Wt 180 lb (81.6 kg)   SpO2 100%   BMI 25.83 kg/m   Visual Acuity Right Eye Distance:   Left Eye Distance:   Bilateral Distance:    Right Eye Near:   Left Eye Near:    Bilateral Near:     Physical Exam Vitals signs and nursing note reviewed.  Constitutional:      Appearance: He is well-developed.  HENT:     Head: Normocephalic and atraumatic.     Right Ear: Tympanic membrane normal.     Left Ear: Tympanic membrane normal.     Mouth/Throat:     Mouth: Mucous membranes are moist.     Pharynx: Oropharynx is clear.  Eyes:     Conjunctiva/sclera: Conjunctivae normal.  Neck:     Musculoskeletal: Neck supple.  Cardiovascular:     Rate and Rhythm: Normal rate and regular rhythm.     Heart sounds: No murmur.  Pulmonary:     Effort: Pulmonary effort is normal. No respiratory distress.     Breath sounds: Normal breath sounds.  Abdominal:     Palpations: Abdomen is soft.     Tenderness:  There is no abdominal tenderness. There is no guarding or rebound.  Skin:    General: Skin is warm and dry.     Findings: No rash.  Neurological:     Mental Status: He is alert.      UC Treatments / Results  Labs (all labs ordered are listed, but only abnormal results are displayed) Labs Reviewed - No data to display  EKG   Radiology No results found.  Procedures Procedures (including critical care time)  Medications Ordered in UC Medications - No data to display  Initial Impression / Assessment and Plan / UC Course  I have reviewed the triage vital signs and the nursing notes.  Pertinent labs & imaging results that were available during my care of the patient were reviewed by me and considered in my medical decision making (see chart for details).   Worried well. Suspect COVID.  COVID test performed here.  Instructed patient to self quarantine until his test result is back.  Instructed patient to go to the emergency department if he develops high fever, shortness of breath, severe diarrhea, or other concerning symptoms.     Final Clinical Impressions(s) / UC Diagnoses   Final diagnoses:  Worried well  Suspected Covid-19 Virus Infection     Discharge Instructions     Your COVID test is pending.  You should self quarantine until your test result is back and is negative.    Go to the emergency department if you develop shortness of breath, high fever, severe diarrhea, or other concerning symptoms.         ED Prescriptions    None     Controlled Substance Prescriptions [place] Controlled Substance Registry consulted? Not Applicable   Mickie Bailate, Tritia Endo H, NP 08/31/18 1729

## 2018-09-02 ENCOUNTER — Encounter (HOSPITAL_COMMUNITY): Payer: Self-pay

## 2018-09-02 LAB — NOVEL CORONAVIRUS, NAA (HOSP ORDER, SEND-OUT TO REF LAB; TAT 18-24 HRS): SARS-CoV-2, NAA: NOT DETECTED

## 2019-03-08 DEATH — deceased

## 2019-12-11 IMAGING — DX PORTABLE CHEST - 1 VIEW
1 series · 1 of 1 positions shown · non-contrast
Comparison: 12/26/2009

CLINICAL DATA: Overdose, shortness of breath

EXAM:
PORTABLE CHEST 1 VIEW

[chest ap]
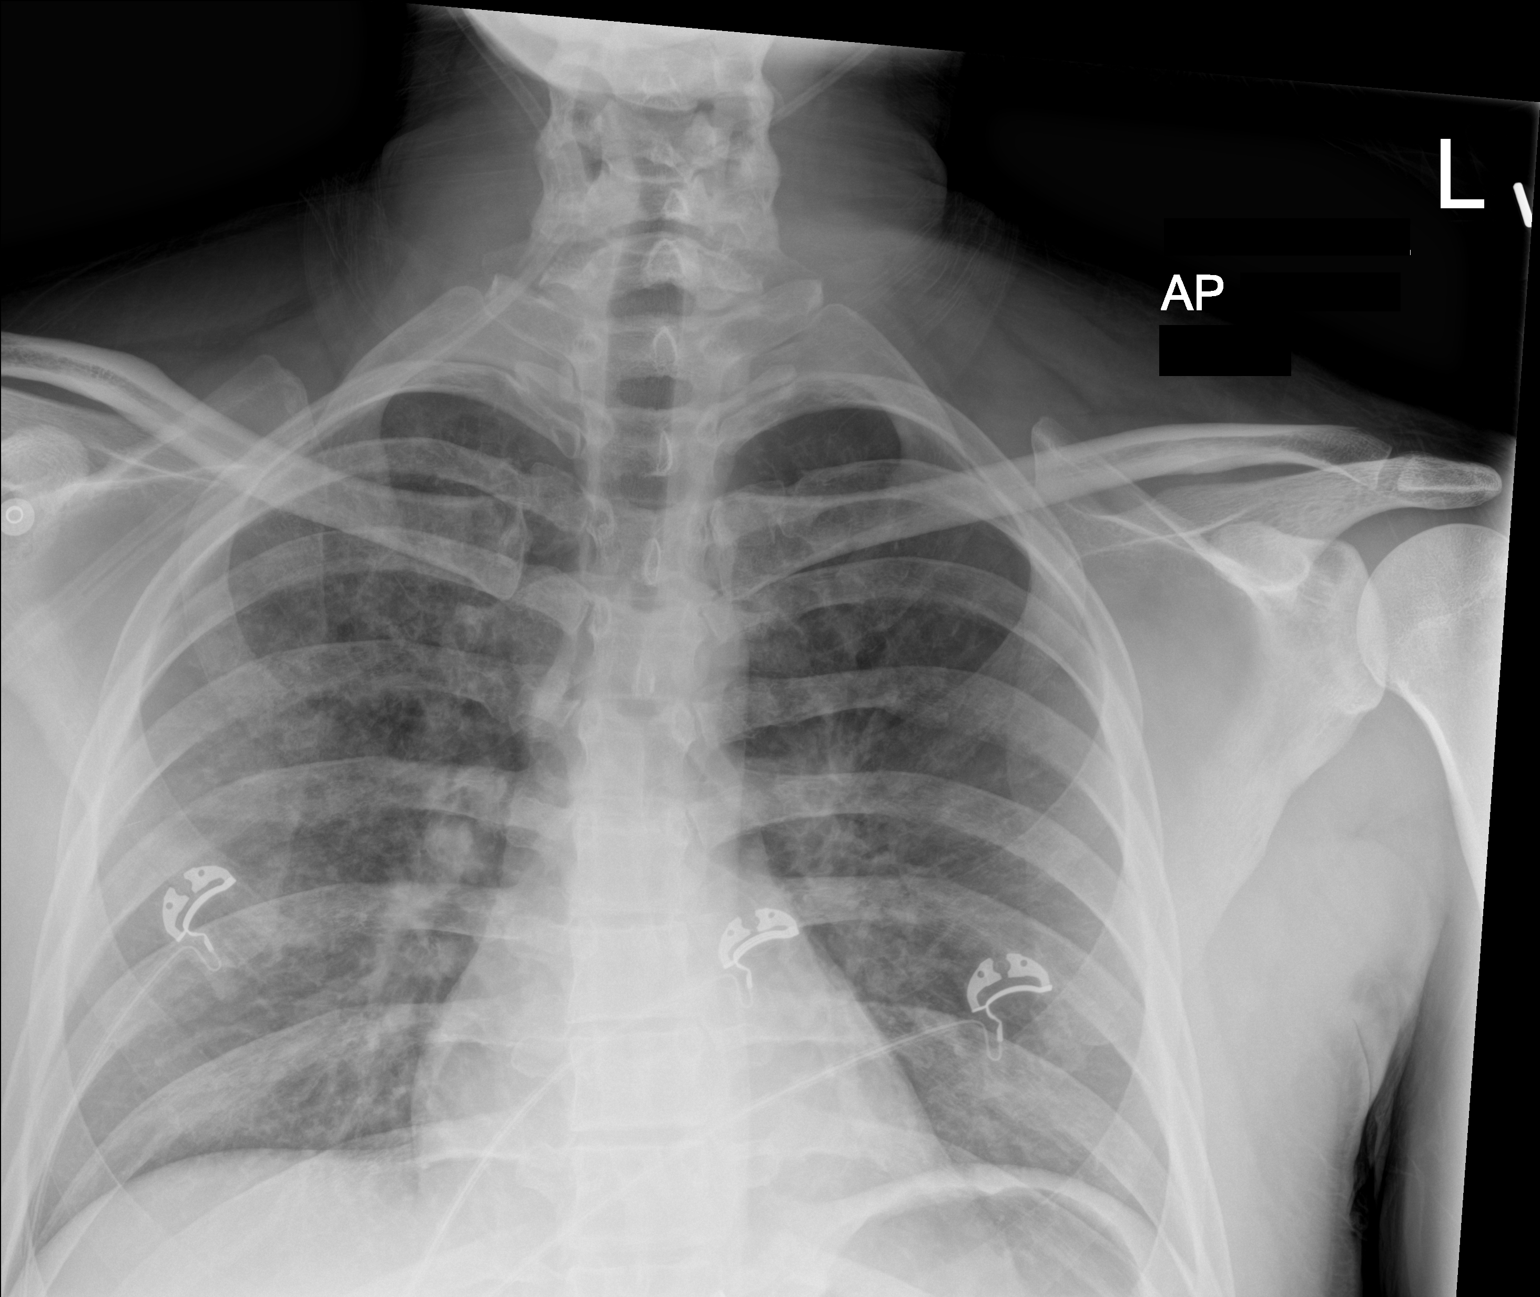

[1 of 1 positions shown; findings below may reference images not displayed]

FINDINGS: Patchy airspace disease throughout the lungs. Heart is normal size.
No effusions. No acute bony abnormality.
IMPRESSION: Patchy ill-defined bilateral airspace disease. This could reflect
noncardiogenic edema, less likely infection.
